# Patient Record
Sex: Female | Born: 1975 | Race: White | Hispanic: No | Smoking: Heavy tobacco smoker
Health system: Southern US, Community
[De-identification: ages and names within clinical notes are randomized; demographics above are authoritative.]

## PROBLEM LIST (undated history)

## (undated) DIAGNOSIS — N200 Calculus of kidney: Secondary | ICD-10-CM

## (undated) HISTORY — PX: KIDNEY CYST REMOVAL: SHX684

---

## 2014-02-19 LAB — METABOLIC PANEL, COMPREHENSIVE
A-G Ratio: 0.8 — ABNORMAL LOW (ref 1.2–3.5)
ALT (SGPT): 42 U/L (ref 12–65)
AST (SGOT): 30 U/L (ref 15–37)
Albumin: 3.7 g/dL (ref 3.5–5.0)
Alk. phosphatase: 142 U/L — ABNORMAL HIGH (ref 50–136)
Anion gap: 6 mmol/L — ABNORMAL LOW (ref 7–16)
BUN: 13 MG/DL (ref 6–23)
Bilirubin, total: 0.3 MG/DL (ref 0.2–1.1)
CO2: 31 mmol/L (ref 21–32)
Calcium: 10 MG/DL (ref 8.3–10.4)
Chloride: 105 mmol/L (ref 98–107)
Creatinine: 1.52 MG/DL — ABNORMAL HIGH (ref 0.6–1.0)
GFR est AA: 49 mL/min/{1.73_m2} — ABNORMAL LOW (ref >60)
GFR est non-AA: 41 mL/min/{1.73_m2} — ABNORMAL LOW (ref >60)
Globulin: 4.7 g/dL — ABNORMAL HIGH (ref 2.3–3.5)
Glucose: 113 mg/dL — ABNORMAL HIGH (ref 65–100)
Potassium: 3.6 mmol/L (ref 3.5–5.1)
Protein, total: 8.4 g/dL — ABNORMAL HIGH (ref 6.3–8.2)
Sodium: 142 mmol/L (ref 136–145)

## 2014-02-19 LAB — CBC WITH AUTOMATED DIFF
ABS. BASOPHILS: 0 10*3/uL (ref 0.0–0.2)
ABS. EOSINOPHILS: 0 10*3/uL (ref 0.0–0.8)
ABS. IMM. GRANS.: 0 10*3/uL (ref 0.0–0.5)
ABS. LYMPHOCYTES: 1.2 10*3/uL (ref 0.5–4.6)
ABS. MONOCYTES: 0.5 10*3/uL (ref 0.1–1.3)
ABS. NEUTROPHILS: 4.1 10*3/uL (ref 1.7–8.2)
BASOPHILS: 0 % (ref 0.0–2.0)
EOSINOPHILS: 1 % (ref 0.5–7.8)
HCT: 44.1 % (ref 35.8–46.3)
HGB: 14.7 g/dL (ref 11.7–15.4)
IMMATURE GRANULOCYTES: 0.2 % (ref 0.0–5.0)
LYMPHOCYTES: 21 % (ref 13–44)
MCH: 31.1 PG (ref 26.1–32.9)
MCHC: 33.3 g/dL (ref 31.4–35.0)
MCV: 93.4 FL (ref 79.6–97.8)
MONOCYTES: 8 % (ref 4.0–12.0)
MPV: 8.9 FL — ABNORMAL LOW (ref 10.8–14.1)
NEUTROPHILS: 70 % (ref 43–78)
PLATELET: 302 10*3/uL (ref 150–450)
RBC: 4.72 M/uL (ref 4.05–5.25)
RDW: 12.8 % (ref 11.9–14.6)
WBC: 5.9 10*3/uL (ref 4.3–11.1)

## 2014-02-19 LAB — URINE MICROSCOPIC
Casts: 0 /lpf
Crystals, urine: 0 /LPF
Mucus: 0 /lpf
WBC: 0 /hpf

## 2014-02-19 LAB — HCG URINE, QL. - POC: Pregnancy test,urine (POC): NEGATIVE

## 2014-02-19 LAB — LIPASE: Lipase: 179 U/L (ref 73–393)

## 2014-02-19 MED ORDER — HYDROMORPHONE (PF) 1 MG/ML IJ SOLN
1 mg/mL | INTRAMUSCULAR | Status: DC
Start: 2014-02-19 — End: 2014-02-19

## 2014-02-19 MED ORDER — HYDROCODONE-ACETAMINOPHEN 5 MG-325 MG TAB
5-325 mg | ORAL_TABLET | ORAL | Status: AC | PRN
Start: 2014-02-19 — End: ?

## 2014-02-19 MED ORDER — TAMSULOSIN SR 0.4 MG 24 HR CAP
0.4 mg | ORAL | Status: AC
Start: 2014-02-19 — End: 2014-02-19
  Administered 2014-02-19: 20:00:00 via ORAL

## 2014-02-19 MED ORDER — SODIUM CHLORIDE 0.9% BOLUS IV
0.9 % | Freq: Once | INTRAVENOUS | Status: AC
Start: 2014-02-19 — End: 2014-02-19
  Administered 2014-02-19: 20:00:00 via INTRAVENOUS

## 2014-02-19 MED ORDER — ONDANSETRON 8 MG TAB, RAPID DISSOLVE
8 mg | ORAL_TABLET | Freq: Three times a day (TID) | ORAL | Status: AC | PRN
Start: 2014-02-19 — End: ?

## 2014-02-19 MED ORDER — ONDANSETRON (PF) 4 MG/2 ML INJECTION
4 mg/2 mL | INTRAMUSCULAR | Status: AC
Start: 2014-02-19 — End: 2014-02-19
  Administered 2014-02-19: 20:00:00 via INTRAVENOUS

## 2014-02-19 MED FILL — TAMSULOSIN SR 0.4 MG 24 HR CAP: 0.4 mg | ORAL | Qty: 1

## 2014-02-19 MED FILL — ONDANSETRON (PF) 4 MG/2 ML INJECTION: 4 mg/2 mL | INTRAMUSCULAR | Qty: 2

## 2014-02-19 MED FILL — HYDROMORPHONE (PF) 1 MG/ML IJ SOLN: 1 mg/mL | INTRAMUSCULAR | Qty: 1

## 2014-02-19 NOTE — ED Notes (Signed)
Pt refused the ct urogram because she has had a lot of them. The fell asleep prior to getting her pain medication and slept for the hour her fluids were given. If she has a stone it is not a very large one. I will discharge her.

## 2014-02-19 NOTE — ED Notes (Signed)
Pt. Is sleeping and had to be awoken to taken oral tablet. Pt. Quickly returned to sleep. MD notified and dilaudid held. Pt. Respirations present with no distress. Pt. O2 sat 100%.

## 2014-02-19 NOTE — ED Notes (Signed)
Pt st abd pain that started this am. Pt st nausea and denies vomiting. Pt st pain increases with urination.

## 2014-02-19 NOTE — ED Provider Notes (Signed)
HPI Comments: 38 year old white female visiting from [place] of flank pain on the left side and left upper abdominal pain that started approximately 6 AM this morning ( 9 hours ago). She has a history of kidney stones and seizures urology and [place].  He knows that she's got several 2 mm stones and a 5 mm stone currently in her kidney pelvis. He has had significant flank pain with vomiting today.  She isn't taking Zofran which she currently has with her but she feels like that nauseous kind back.  She has no pain medicine.  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  She says also has had a total abdominal hysterectomy.    Patient is a 38 y.o. female presenting with abdominal pain.   Abdominal Pain   Associated symptoms include frequency and hematuria. Pertinent negatives include no fever, no dysuria, no headaches, no chest pain and no back pain.        History reviewed. No pertinent past medical history.     Past Surgical History   Procedure Laterality Date   ??? Hx nephrectomy     ??? Hx cholecystectomy     ??? Hx orthopaedic           History reviewed. No pertinent family history.     History     Social History   ??? Marital Status: DIVORCED     Spouse Name: N/A     Number of Children: N/A   ??? Years of Education: N/A     Occupational History   ??? Not on file.     Social History Main Topics   ??? Smoking status: Current Every Day Smoker -- 0.25 packs/day   ??? Smokeless tobacco: Not on file   ??? Alcohol Use: No   ??? Drug Use: Not on file   ??? Sexual Activity: Not on file     Other Topics Concern   ??? Not on file     Social History Narrative   ??? No narrative on file                  ALLERGIES: Toradol and Tramadol      Review of Systems   Constitutional: Negative.  Negative for fever and fatigue.   HENT: Negative.  Negative for congestion and sinus pressure.    Eyes: Negative.    Respiratory: Negative.  Negative for cough, chest tightness and shortness of breath.     Cardiovascular: Negative.  Negative for chest pain and leg swelling.   Gastrointestinal: Positive for abdominal pain.   Genitourinary: Positive for frequency, hematuria and flank pain. Negative for dysuria, urgency, decreased urine volume, vaginal bleeding, difficulty urinating and pelvic pain.   Musculoskeletal: Negative.  Negative for back pain, joint swelling, neck pain and neck stiffness.   Skin: Negative.  Negative for rash.   Neurological: Negative.  Negative for headaches.   Psychiatric/Behavioral: Negative.  Negative for confusion and agitation.   All other systems reviewed and are negative.      Filed Vitals:    02/19/14 1259   BP: 120/83   Pulse: 127   Temp: 98.3 ??F (36.8 ??C)   Resp: 20   Height: 5' 3.75" (1.619 m)   Weight: 73.483 kg (162 lb)   SpO2: 99%            Physical Exam   Constitutional: She is oriented to person, place, and time. She appears well-developed and well-nourished.   HENT:   Head: Normocephalic and  atraumatic.   Right Ear: External ear normal.   Left Ear: External ear normal.   Nose: Nose normal.   Mouth/Throat: Oropharynx is clear and moist.   Poor dentition   Eyes: Conjunctivae and EOM are normal. Pupils are equal, round, and reactive to light. Right eye exhibits no discharge. Left eye exhibits no discharge. No scleral icterus.   Neck: Normal range of motion. Neck supple. No thyromegaly present.   Cardiovascular: Normal rate, regular rhythm, normal heart sounds and intact distal pulses.  Exam reveals no gallop and no friction rub.    No murmur heard.  Pulmonary/Chest: Effort normal and breath sounds normal. No respiratory distress. She has no wheezes. She has no rales. She exhibits no tenderness.   Abdominal: Soft. Bowel sounds are normal. She exhibits no distension and no mass. There is tenderness. There is guarding. There is no rebound.       Musculoskeletal: Normal range of motion. She exhibits no edema or tenderness.    Neurological: She is alert and oriented to person, place, and time. She displays normal reflexes. No cranial nerve deficit. She exhibits normal muscle tone. Coordination normal.   Skin: Skin is warm and dry.   Nursing note and vitals reviewed.       MDM    Procedures

## 2014-02-19 NOTE — ED Notes (Signed)
I have reviewed discharge instructions with the patient.  The patient verbalized understanding. IV removed and site bandaged. Bleeding controlled.

## 2014-12-20 ENCOUNTER — Encounter: Payer: Self-pay | Admitting: Emergency Medicine

## 2014-12-20 ENCOUNTER — Emergency Department: Payer: Medicare Other

## 2014-12-20 ENCOUNTER — Emergency Department
Admission: EM | Admit: 2014-12-20 | Discharge: 2014-12-20 | Disposition: A | Discharge status: Home / Self Care | Payer: Medicare Other | Attending: Emergency Medicine | Admitting: Emergency Medicine

## 2014-12-20 DIAGNOSIS — R300 Dysuria: Secondary | ICD-10-CM | POA: Diagnosis present

## 2014-12-20 DIAGNOSIS — R109 Unspecified abdominal pain: Secondary | ICD-10-CM

## 2014-12-20 DIAGNOSIS — N3090 Cystitis, unspecified without hematuria: Secondary | ICD-10-CM | POA: Diagnosis not present

## 2014-12-20 DIAGNOSIS — N3091 Cystitis, unspecified with hematuria: Secondary | ICD-10-CM

## 2014-12-20 DIAGNOSIS — Z720 Tobacco use: Secondary | ICD-10-CM | POA: Insufficient documentation

## 2014-12-20 HISTORY — DX: Calculus of kidney: N20.0

## 2014-12-20 LAB — COMPREHENSIVE METABOLIC PANEL
ALBUMIN: 3.7 g/dL (ref 3.5–5.0)
ALK PHOS: 125 U/L (ref 38–126)
ALT: 78 U/L — AB (ref 14–54)
ANION GAP: 8 (ref 5–15)
AST: 33 U/L (ref 15–41)
BILIRUBIN TOTAL: 0.4 mg/dL (ref 0.3–1.2)
BUN: 14 mg/dL (ref 6–20)
CHLORIDE: 104 mmol/L (ref 101–111)
CO2: 27 mmol/L (ref 22–32)
Calcium: 9.1 mg/dL (ref 8.9–10.3)
Creatinine, Ser: 1.28 mg/dL — ABNORMAL HIGH (ref 0.44–1.00)
GFR calc Af Amer: >60 mL/min (ref >60)
GFR calc non Af Amer: 52 mL/min — ABNORMAL LOW (ref >60)
Glucose, Bld: 100 mg/dL — ABNORMAL HIGH (ref 65–99)
Potassium: 3.1 mmol/L — ABNORMAL LOW (ref 3.5–5.1)
SODIUM: 139 mmol/L (ref 135–145)
Total Protein: 7.2 g/dL (ref 6.5–8.1)

## 2014-12-20 LAB — URINALYSIS COMPLETE WITH MICROSCOPIC (ARMC ONLY)
Bilirubin Urine: NEGATIVE
Glucose, UA: NEGATIVE mg/dL
KETONES UR: NEGATIVE mg/dL
Nitrite: NEGATIVE
Protein, ur: 30 mg/dL — AB
SPECIFIC GRAVITY, URINE: 1.014 (ref 1.005–1.030)
pH: 6 (ref 5.0–8.0)

## 2014-12-20 LAB — CBC WITH DIFFERENTIAL/PLATELET
BASOS ABS: 0 10*3/uL (ref 0–0.1)
Basophils Relative: 1 %
EOS ABS: 0.2 10*3/uL (ref 0–0.7)
Eosinophils Relative: 4 %
HEMATOCRIT: 36.2 % (ref 35.0–47.0)
HEMOGLOBIN: 12.2 g/dL (ref 12.0–16.0)
Lymphocytes Relative: 31 %
Lymphs Abs: 1.8 10*3/uL (ref 1.0–3.6)
MCH: 30.7 pg (ref 26.0–34.0)
MCHC: 33.7 g/dL (ref 32.0–36.0)
MCV: 91.1 fL (ref 80.0–100.0)
MONO ABS: 0.6 10*3/uL (ref 0.2–0.9)
MONOS PCT: 11 %
NEUTROS ABS: 3.2 10*3/uL (ref 1.4–6.5)
NEUTROS PCT: 53 %
Platelets: 258 10*3/uL (ref 150–440)
RBC: 3.97 MIL/uL (ref 3.80–5.20)
RDW: 12.3 % (ref 11.5–14.5)
WBC: 5.9 10*3/uL (ref 3.6–11.0)

## 2014-12-20 MED ORDER — HYDROMORPHONE HCL 1 MG/ML IJ SOLN
1.0000 mg | Freq: Once | INTRAMUSCULAR | Status: AC
  Administered 2014-12-20: 1 mg via INTRAVENOUS

## 2014-12-20 MED ORDER — SULFAMETHOXAZOLE-TRIMETHOPRIM 800-160 MG PO TABS: 1.0000 | ORAL_TABLET | Freq: Two times a day (BID) | ORAL | Status: AC

## 2014-12-20 MED ORDER — CEFTRIAXONE SODIUM IN DEXTROSE 20 MG/ML IV SOLN
1.0000 g | Freq: Once | INTRAVENOUS | Status: AC
  Administered 2014-12-20: 1 g via INTRAVENOUS

## 2014-12-20 MED ORDER — CEFTRIAXONE SODIUM IN DEXTROSE 20 MG/ML IV SOLN
INTRAVENOUS | Status: AC
  Administered 2014-12-20: 1 g via INTRAVENOUS
  Filled 2014-12-20: qty 50

## 2014-12-20 MED ORDER — ONDANSETRON HCL 4 MG/2ML IJ SOLN
4.0000 mg | Freq: Once | INTRAMUSCULAR | Status: AC
  Administered 2014-12-20: 4 mg via INTRAVENOUS

## 2014-12-20 MED ORDER — ONDANSETRON HCL 4 MG/2ML IJ SOLN
INTRAMUSCULAR | Status: AC
  Administered 2014-12-20: 4 mg via INTRAVENOUS
  Filled 2014-12-20: qty 2

## 2014-12-20 MED ORDER — OXYCODONE-ACETAMINOPHEN 5-325 MG PO TABS: 1.0000 | ORAL_TABLET | Freq: Four times a day (QID) | ORAL | Status: AC | PRN

## 2014-12-20 MED ORDER — HYDROMORPHONE HCL 1 MG/ML IJ SOLN
INTRAMUSCULAR | Status: AC
  Administered 2014-12-20: 1 mg via INTRAVENOUS
  Filled 2014-12-20: qty 1

## 2014-12-20 MED ORDER — SODIUM CHLORIDE 0.9 % IV BOLUS (SEPSIS)
1000.0000 mL | Freq: Once | INTRAVENOUS | Status: AC
  Administered 2014-12-20: 1000 mL via INTRAVENOUS

## 2014-12-20 NOTE — ED Provider Notes (Signed)
[facility] Emergency Department Provider Note ___________________________________________  Time seen: Approximately 5:25 AM  I have reviewed the triage vital signs and the nursing notes.   HISTORY  Chief Complaint Nephrolithiasis; Dysuria; and Hematuria  HPI Brittany Hayden is a 39 y.o. female who has had long history of multiple kidney stones in the past. Patient currently has hematuria and severe left flank pain that started earlier this evening. Patient states the pain has gotten so severe she can hardly stand it. Patient states that she has been able to pass her multiple previous kidney stones but has had multiple other surgeries. The pain today on a scale of 0-10 is a about a 9. Patient states his severe crescendo decrescendo type pain. Nothing seems to make it worse or better and other than the pain she does not have any significant dysuria, frequency, nausea, vomiting, diarrhea, or fever or chills. Patient does have some pretty significant hematuria.   Past Medical History  Diagnosis Date  . Kidney stones     There are no active problems to display for this patient.   Past Surgical History  Procedure Laterality Date  . Kidney cyst removal      Current Outpatient Rx  Name  Route  Sig  Dispense  Refill  . oxyCODONE-acetaminophen (ROXICET) 5-325 MG per tablet   Oral   Take 1 tablet by mouth every 6 (six) hours as needed for severe pain.   20 tablet   0   . sulfamethoxazole-trimethoprim (BACTRIM DS,SEPTRA DS) 800-160 MG per tablet   Oral   Take 1 tablet by mouth 2 (two) times daily.   20 tablet   0     Allergies Demerol and Toradol  History reviewed. No pertinent family history.  Social History History  Substance Use Topics  . Smoking status: Heavy Tobacco Smoker  . Smokeless tobacco: Not on file  . Alcohol Use: Yes    Review of Systems Constitutional: No fever/chills Eyes: No visual changes. ENT: No sore throat. Cardiovascular:  Denies chest pain. Respiratory: Denies shortness of breath. Gastrointestinal: Severe left flank pain.  No nausea, no vomiting.  No diarrhea.  No constipation. Genitourinary: Negative for dysuria. Patient with significant hematuria Musculoskeletal: Negative for back pain. Skin: Negative for rash. Neurological: Negative for headaches, focal weakness or numbness.  10-point ROS otherwise negative.  ____________________________________________   PHYSICAL EXAM:  VITAL SIGNS: ED Triage Vitals  Enc Vitals Group     BP 12/20/14 0431 111/75 mmHg     Pulse Rate 12/20/14 0431 91     Resp 12/20/14 0431 22     Temp 12/20/14 0431 97.8 F (36.6 C)     Temp Source 12/20/14 0431 Oral     SpO2 12/20/14 0431 100 %     Weight 12/20/14 0431 175 lb (79.379 kg)     Height 12/20/14 0431 5\' 2"  (1.575 m)     Head Cir --      Peak Flow --      Pain Score 12/20/14 0423 8     Pain Loc --      Pain Edu? --      Excl. in GC? --     Constitutional: Alert and oriented. Well appearing and in in obvious distress from her pain. Eyes: Conjunctivae are normal. PERRL. EOMI. Head: Atraumatic. Nose: No congestion/rhinnorhea. Mouth/Throat: Mucous membranes are moist.  Oropharynx non-erythematous. Neck: No stridor.   Cardiovascular: Normal rate, regular rhythm. Grossly normal heart sounds.  Good peripheral circulation. Respiratory: Normal respiratory  effort.  No retractions. Lungs CTAB. Gastrointestinal: Soft and tender to palpation along her entire left anterior lateral flank. No distention. No abdominal bruits. No CVA tenderness. Musculoskeletal: No lower extremity tenderness nor edema.  No joint effusions. Neurologic:  Normal speech and language. No gross focal neurologic deficits are appreciated. Speech is normal. No gait instability. Skin:  Skin is warm, dry and intact. No rash noted. Psychiatric: Mood and affect are normal. Speech and behavior are normal.  ____________________________________________    LABS (all labs ordered are listed, but only abnormal results are displayed)  Labs Reviewed  COMPREHENSIVE METABOLIC PANEL - Abnormal; Notable for the following:    Potassium 3.1 (*)    Glucose, Bld 100 (*)    Creatinine, Ser 1.28 (*)    ALT 78 (*)    GFR calc non Af Amer 52 (*)    All other components within normal limits  URINALYSIS COMPLETEWITH MICROSCOPIC ([facility] ONLY) - Abnormal; Notable for the following:    Color, Urine RED (*)    APPearance HAZY (*)    Hgb urine dipstick 3+ (*)    Protein, ur 30 (*)    Leukocytes, UA 3+ (*)    Bacteria, UA RARE (*)    Squamous Epithelial / LPF 6-30 (*)    All other components within normal limits  CBC WITH DIFFERENTIAL/PLATELET   ____________________________________________  EKG  None  RADIOLOGY  Ct Renal Stone Study  12/20/2014   CLINICAL DATA:  Left flank pain  EXAM: CT ABDOMEN AND PELVIS WITHOUT CONTRAST  TECHNIQUE: Multidetector CT imaging of the abdomen and pelvis was performed following the standard protocol without IV contrast.  COMPARISON:  None.  FINDINGS: BODY WALL: No contributory findings.  LOWER CHEST: Subsegmental atelectasis bilaterally.  ABDOMEN/PELVIS:  Liver: No focal abnormality.  Biliary: Cholecystectomy.  No bile duct enlargement.  Pancreas: Unremarkable.  Spleen: Unremarkable.  Adrenals: Unremarkable.  Kidneys and ureters: Right nephrectomy, reportedly for congenital cysts. No left-sided hydronephrosis or nephrolithiasis. No left renal cysts.  Bladder: Unremarkable.  Reproductive: Hysterectomy.  No adnexal mass.  Bowel: No obstruction. Appendectomy. No inflammatory bowel wall thickening. Few colonic diverticula.  Retroperitoneum: No mass or adenopathy.  Peritoneum: No ascites or pneumoperitoneum.  Vascular: No acute abnormality.  OSSEOUS: No acute abnormalities.  IMPRESSION: Unremarkable appearance of the solitary left kidney.  Mild colonic diverticulosis.  Appendectomy, hysterectomy, and cholecystectomy.   Electronically  Signed   By: Marnee Spring M.D.   On: 12/20/2014 05:51    ____________________________________________   PROCEDURES  Procedure(s) performed: None  Critical Care performed: No  ____________________________________________   INITIAL IMPRESSION / ASSESSMENT AND PLAN / ED COURSE  Pertinent labs & imaging results that were available during my care of the patient were reviewed by me and considered in my medical decision making (see chart for details).  ----------------------------------------- 5:29 AM on 12/20/2014 -----------------------------------------  Patient is going to be given some IV fluids along with some IV pain and nausea medicine while awaiting labs and CT for stone study.  ----------------------------------------- 5:59 AM on 12/20/2014 -----------------------------------------  Patient had no evidence of kidney stone on her CT for stone study. Patient had obvious evidence of urinary tract infection. So we gave her some IV Rocephin as well to the IV along with her pain medicine and nausea medicine. Patient also received a liter of IV fluids. Patient is to have a urine culture sent and she is going to be placed on Septra DS for her hemorrhagic cystitis. Patient is to return immediately if condition worsens  and follow up with her PMD in 2-3 days for recheck ____________________________________________   FINAL CLINICAL IMPRESSION(S) / ED DIAGNOSES  Final diagnoses:  Left flank pain  Hemorrhagic cystitis      Leona CarryLinda M Aviannah Castoro, MD 12/20/14 [phone]

## 2014-12-20 NOTE — ED Notes (Signed)
Pt arrived to the ED for complaints of left flank pain and hematuria. Pt states that she has a hx of kidney stones and kidney problems. Pt states that she only has one kidney. Pt is AOx4 in moderate pain distress.

## 2014-12-22 LAB — URINE CULTURE

## 2015-11-16 IMAGING — CT CT RENAL STONE PROTOCOL
1 of 4 series · 3 of 46 positions shown, 7 images · non-contrast
Comparison: None.

CLINICAL DATA: Left flank pain

EXAM:
CT ABDOMEN AND PELVIS WITHOUT CONTRAST
TECHNIQUE: Multidetector CT imaging of the abdomen and pelvis was performed
following the standard protocol without IV contrast.

[Series 4: lung windows · axial · 0.72mm/px · z∈[-122,-82]mm · 3 of 16 slices shown, 7 images]
[im 4/16  soft-tissue]
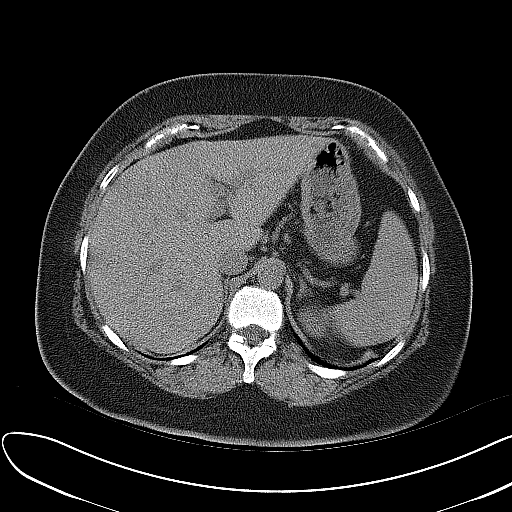
[im 4/16  lung]
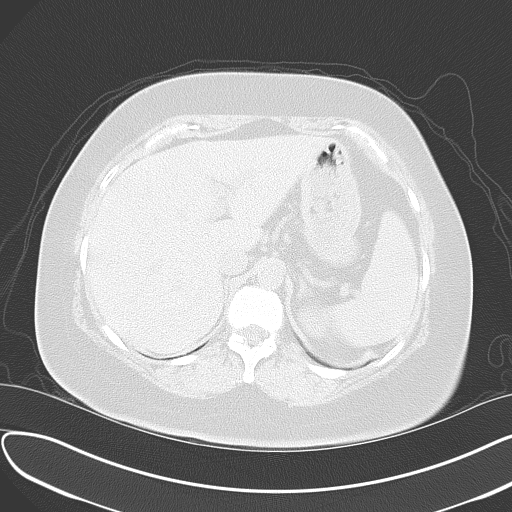
[im 4/16  bone]
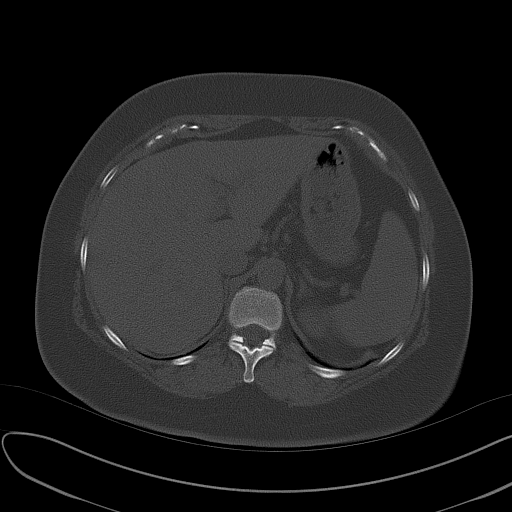
[im 8/16  soft-tissue]
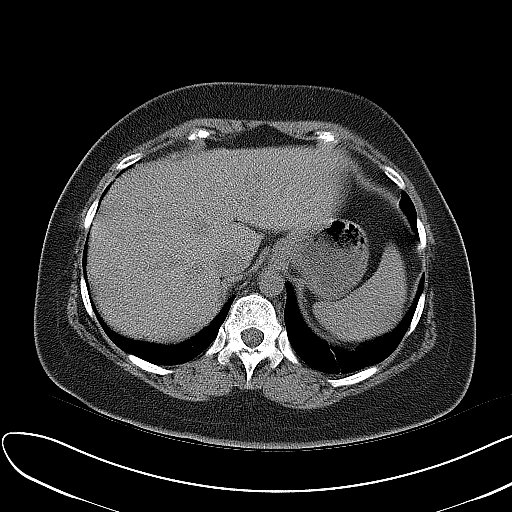
[im 8/16  lung]
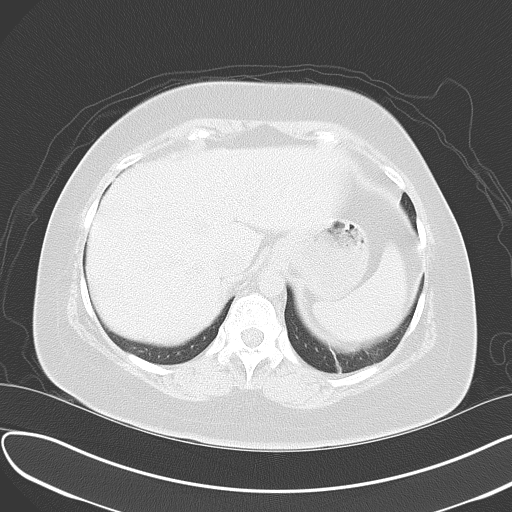
[im 12/16  soft-tissue]
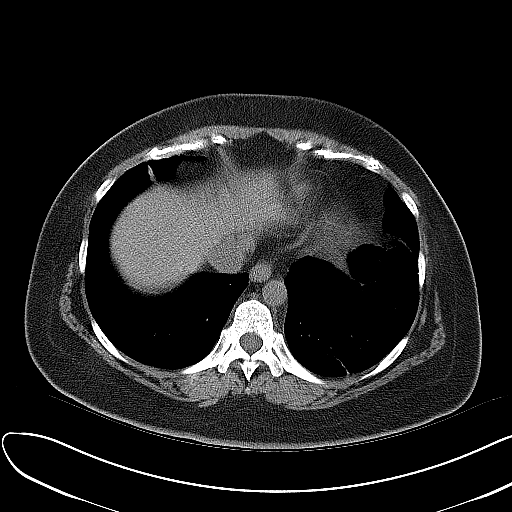
[im 12/16  lung]
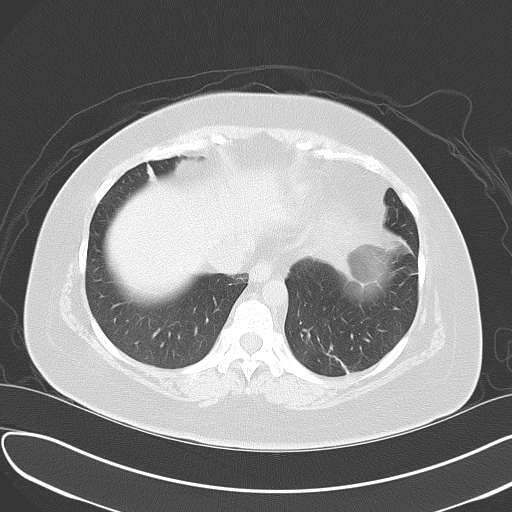

[3 of 46 positions shown; findings below may reference images not displayed]

FINDINGS: BODY WALL: No contributory findings.

LOWER CHEST: Subsegmental atelectasis bilaterally.

ABDOMEN/PELVIS:

Liver: No focal abnormality.

Biliary: Cholecystectomy.  No bile duct enlargement.

Pancreas: Unremarkable.

Spleen: Unremarkable.

Adrenals: Unremarkable.

Kidneys and ureters: Right nephrectomy, reportedly for congenital
cysts. No left-sided hydronephrosis or nephrolithiasis. No left
renal cysts.

Bladder: Unremarkable.

Reproductive: Hysterectomy.  No adnexal mass.

Bowel: No obstruction. Appendectomy. No inflammatory bowel wall
thickening. Few colonic diverticula.

Retroperitoneum: No mass or adenopathy.

Peritoneum: No ascites or pneumoperitoneum.

Vascular: No acute abnormality.

OSSEOUS: No acute abnormalities.
IMPRESSION: Unremarkable appearance of the solitary left kidney.

Mild colonic diverticulosis.

Appendectomy, hysterectomy, and cholecystectomy.
